# Patient Record
Sex: Male | Born: 1980 | Race: White | Hispanic: No | Marital: Married
Health system: Southern US, Community
[De-identification: ages and names within clinical notes are randomized; demographics above are authoritative.]

---
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
## 2008-12-25 ENCOUNTER — Emergency Department (HOSPITAL_COMMUNITY): Admission: EM | Admit: 2008-12-25 | Discharge: 2008-12-26 | Payer: Self-pay | Admitting: Emergency Medicine

## 2010-08-08 IMAGING — CR DG ANKLE COMPLETE 3+V*R*
3 series · 3 of 3 positions shown · non-contrast
Comparison: None

CLINICAL DATA: Laceration, broken window

RIGHT ANKLE - COMPLETE 3+ VIEW

[t ankle joint ap right]
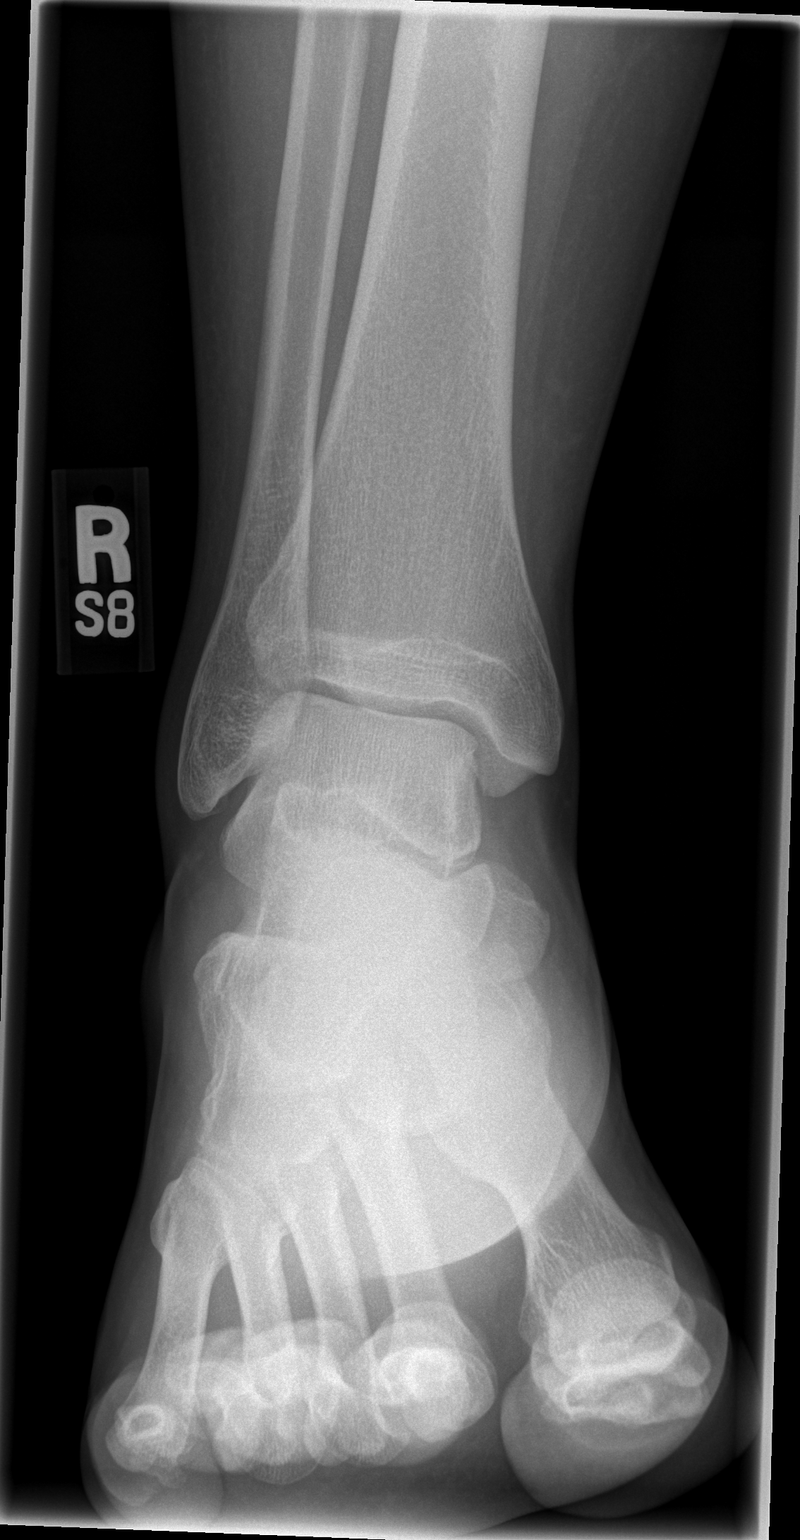

[t ankle joint oblique right]
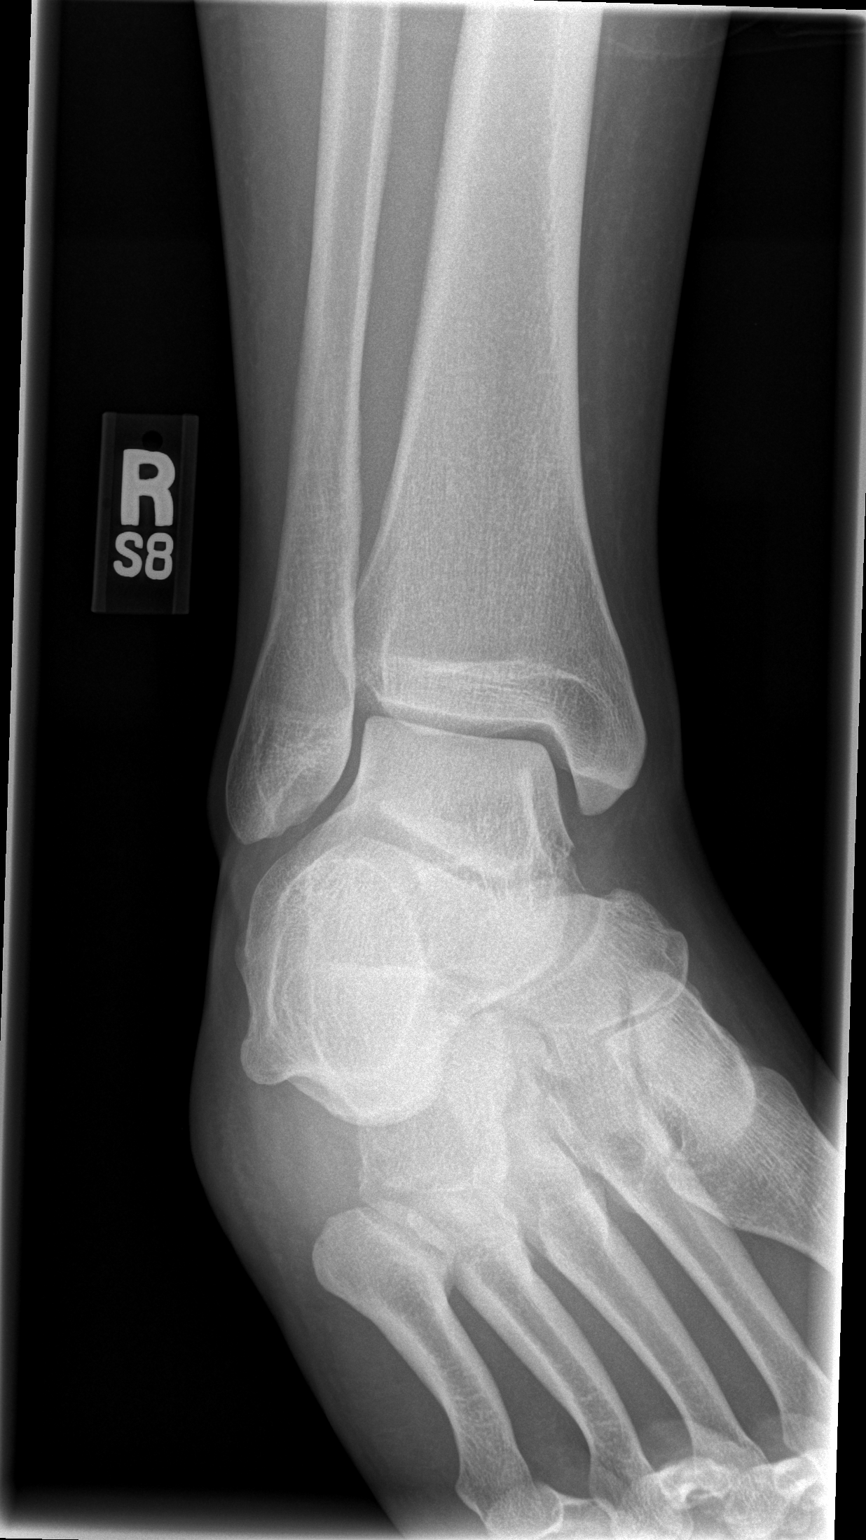

[t ankle joint lat right]
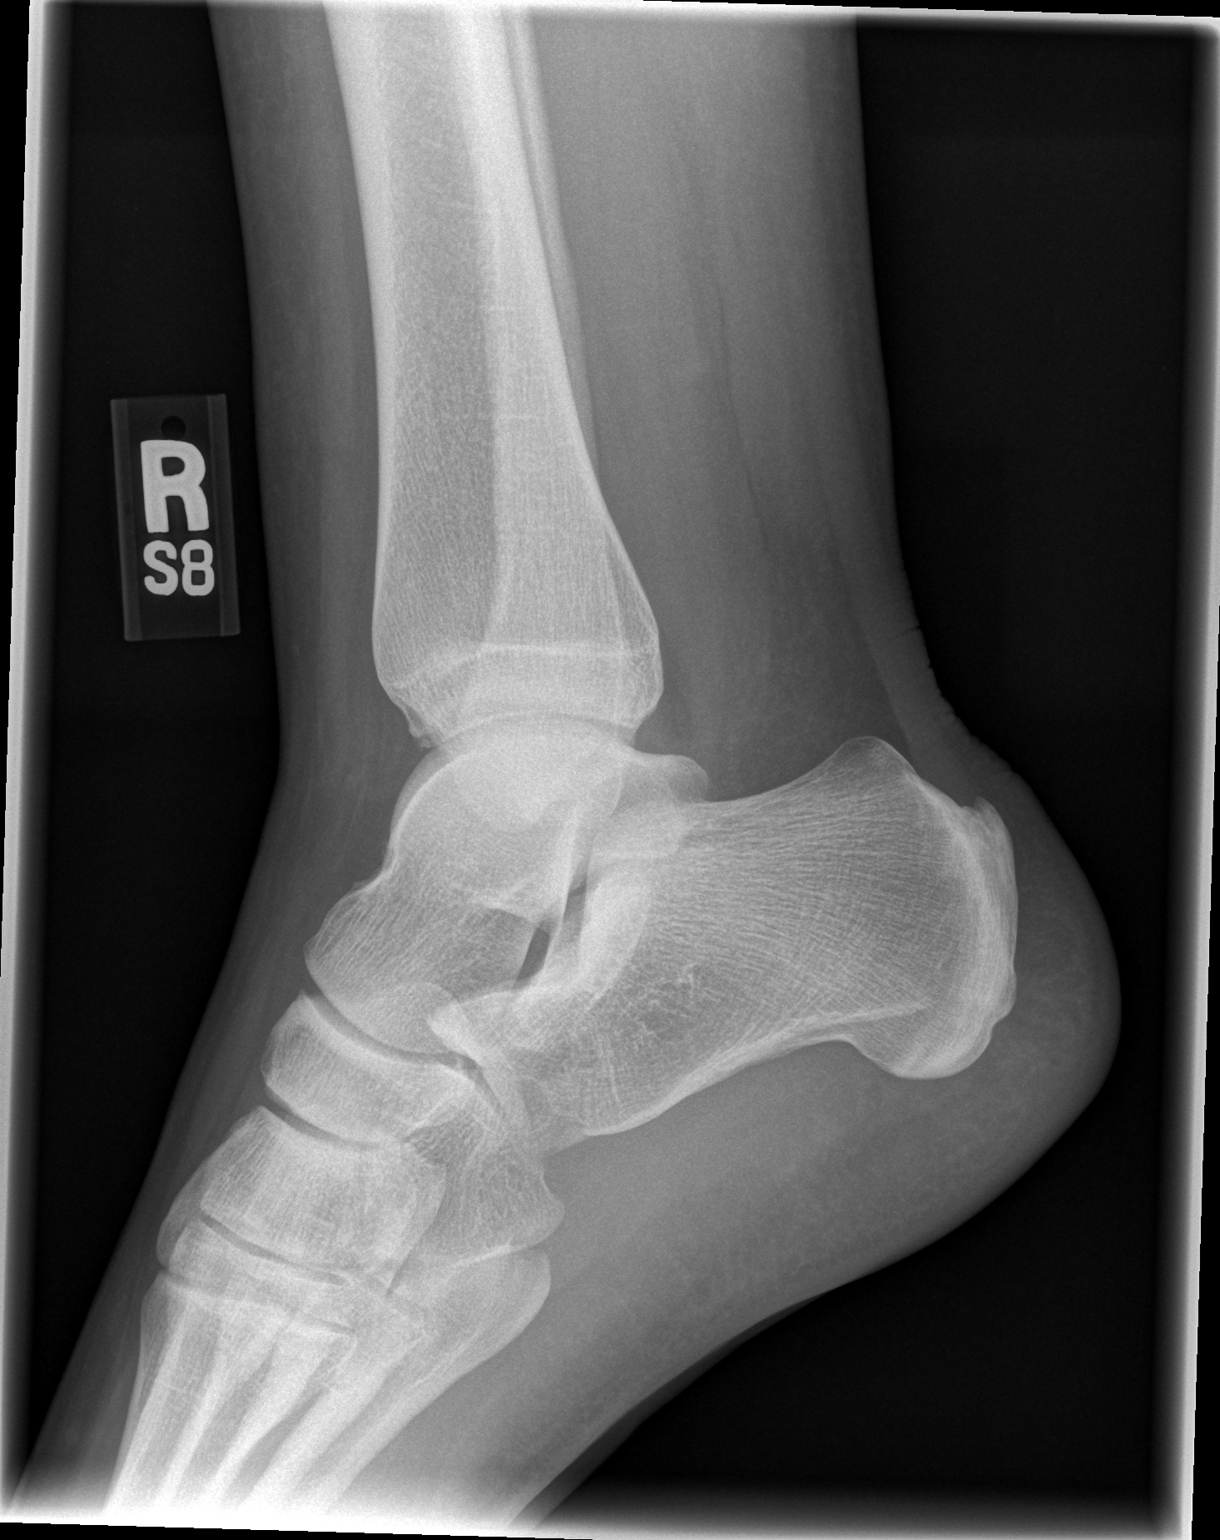

[3 of 3 positions shown; findings below may reference images not displayed]

FINDINGS: Ankle mortise intact.
Bone mineralization normal.
No acute fracture, dislocation, or bone destruction.
No radiopaque foreign body identified.
IMPRESSION: No acute abnormalities.

## 2018-09-24 ENCOUNTER — Emergency Department (HOSPITAL_COMMUNITY)
Admission: EM | Admit: 2018-09-24 | Discharge: 2018-09-24 | Disposition: A | Discharge status: Home / Self Care | Payer: 59 | Attending: Emergency Medicine | Admitting: Emergency Medicine

## 2018-09-24 ENCOUNTER — Encounter (HOSPITAL_COMMUNITY): Payer: Self-pay | Admitting: Emergency Medicine

## 2018-09-24 ENCOUNTER — Other Ambulatory Visit: Payer: Self-pay

## 2018-09-24 DIAGNOSIS — W260XXA Contact with knife, initial encounter: Secondary | ICD-10-CM | POA: Diagnosis not present

## 2018-09-24 DIAGNOSIS — S61411A Laceration without foreign body of right hand, initial encounter: Secondary | ICD-10-CM | POA: Insufficient documentation

## 2018-09-24 DIAGNOSIS — S6991XA Unspecified injury of right wrist, hand and finger(s), initial encounter: Secondary | ICD-10-CM | POA: Diagnosis present

## 2018-09-24 DIAGNOSIS — Y999 Unspecified external cause status: Secondary | ICD-10-CM | POA: Insufficient documentation

## 2018-09-24 DIAGNOSIS — Y9389 Activity, other specified: Secondary | ICD-10-CM | POA: Diagnosis not present

## 2018-09-24 DIAGNOSIS — Z23 Encounter for immunization: Secondary | ICD-10-CM | POA: Insufficient documentation

## 2018-09-24 DIAGNOSIS — Y929 Unspecified place or not applicable: Secondary | ICD-10-CM | POA: Insufficient documentation

## 2018-09-24 MED ORDER — LIDOCAINE HCL (PF) 1 % IJ SOLN
30.0000 mL | Freq: Once | INTRAMUSCULAR | Status: AC
  Administered 2018-09-24: 30 mL
  Filled 2018-09-24: qty 30

## 2018-09-24 MED ORDER — BACITRACIN ZINC 500 UNIT/GM EX OINT
TOPICAL_OINTMENT | Freq: Once | CUTANEOUS | Status: AC
  Administered 2018-09-24: 21:00:00 via TOPICAL
  Filled 2018-09-24: qty 0.9

## 2018-09-24 MED ORDER — TETANUS-DIPHTH-ACELL PERTUSSIS 5-2.5-18.5 LF-MCG/0.5 IM SUSP
0.5000 mL | Freq: Once | INTRAMUSCULAR | Status: AC
  Administered 2018-09-24: 22:00:00 0.5 mL via INTRAMUSCULAR
  Filled 2018-09-24: qty 0.5

## 2018-09-24 NOTE — Discharge Instructions (Addendum)
1. Medications: Tylenol or ibuprofen for pain, Continue any usual home medications  2. Treatment: ice for swelling, keep wound clean with warm soap and water and keep bandage dry, do not submerge in water for 24 hours  3. Follow Up: Please return in 10 days to have your stitches/staples removed or sooner if you have concerns. Return to the emergency department for increased redness, drainage of pus from the wound.   WOUND CARE  Keep area clean and dry for 24 hours. Do not remove bandage, if applied.  After 24 hours, remove bandage and wash wound gently with mild soap and warm water. Reapply a new bandage after cleaning wound, if directed.   Continue daily cleansing with soap and water until stitches/staples are removed.  Do not apply any ointments or creams to the wound while stitches/staples are in place, as this may cause delayed healing. Return if you experience any of the following signs of infection: Swelling, redness, pus drainage, streaking, fever >101.0 F  Return if you experience excessive bleeding that does not stop after 15-20 minutes of constant, firm pressure.

## 2018-09-24 NOTE — ED Triage Notes (Signed)
Pt was working on his jeep. Pt cut his hand with a carpenters knife.

## 2018-09-24 NOTE — ED Provider Notes (Signed)
[facility] EMERGENCY DEPARTMENT Provider Note   CSN: [id number] Arrival date & time: 09/24/18  2010    History   Chief Complaint No chief complaint on file.   HPI Wayne Yang is a 38 y.o. male otherwise healthy presenting to emergency department today with chief complaint of laceration to right hand.  This happened 30 minutes prior to arrival.  Patient states he was using a knife to cut a piece of plastic and the knife slipped landing on his right hand.  Laceration to dorsal aspect of right hand.  Bleeding is controlled.  Patient reports throbbing pain with movement, no pain at rest.  Pain does not radiate.  With movement he rates pain 6 out of 10 in severity.  He not take anything for pain prior to arrival.  He is left-hand dominant.  Patient is unsure when last tetanus immunization was.  History provided by patient.  History reviewed. No pertinent past medical history.  There are no active problems to display for this patient.   History reviewed. No pertinent surgical history.      Home Medications    Prior to Admission medications   Not on File    Family History No family history on file.  Social History Social History   Tobacco Use  . Smoking status: Not on file  Substance Use Topics  . Alcohol use: Not on file  . Drug use: Not on file     Allergies   Patient has no allergy information on record.   Review of Systems Review of Systems  Constitutional: Negative for fever.  Musculoskeletal: Negative for arthralgias and myalgias.  Skin: Positive for wound.     Physical Exam Updated Vital Signs BP 123/76 (BP Location: Right Arm)   Pulse 86   Temp 98.6 F (37 C) (Oral)   Resp 16   Ht 6' (1.829 m)   Wt 106.6 kg   SpO2 98%   BMI 31.87 kg/m   Physical Exam Vitals signs and nursing note reviewed.  Constitutional:      Appearance: He is well-developed. He is not toxic-appearing.  HENT:     Head: Normocephalic and  atraumatic.  Eyes:     General: No scleral icterus.       Right eye: No discharge.        Left eye: No discharge.     Conjunctiva/sclera: Conjunctivae normal.  Neck:     Musculoskeletal: Normal range of motion.  Cardiovascular:     Rate and Rhythm: Normal rate and regular rhythm.     Pulses: Normal pulses.          Radial pulses are 2+ on the right side and 2+ on the left side.     Heart sounds: Normal heart sounds.  Pulmonary:     Effort: Pulmonary effort is normal.     Breath sounds: Normal breath sounds.  Abdominal:     General: There is no distension.  Musculoskeletal: Normal range of motion.     Comments: Sensation grossly intact to light touch through each of the nerve distributions of the right upper extremity. Abduction and adduction of the fingers intact against resistance. Grip strength equal bilaterally. Supination and pronation intact against resistance. Strength 5/5 through the cardinal directions of the right wrist. Patient can touch the thumb to each one of the fingertips without difficulty.      Skin:    General: Skin is warm and dry.     Comments: 2 cm laceration  to dorsal aspect of right hand. Bleeding is controlled.  Neurological:     Mental Status: He is oriented to person, place, and time.     Comments: Fluent speech, no facial droop.  Psychiatric:        Behavior: Behavior normal.      ED Treatments / Results  Labs (all labs ordered are listed, but only abnormal results are displayed) Labs Reviewed - No data to display  EKG None  Radiology No results found.  Procedures .Marland KitchenLaceration Repair Date/Time: 09/24/2018 9:21 PM Performed by: Sherene Sires, PA-C Authorized by: Sherene Sires, PA-C   Consent:    Consent obtained:  Verbal   Consent given by:  Patient   Risks discussed:  Infection and pain   Alternatives discussed:  No treatment Universal protocol:    Patient identity confirmed:  Verbally with patient Anesthesia (see MAR  for exact dosages):    Anesthesia method:  Local infiltration   Local anesthetic:  Lidocaine 1% w/o epi Laceration details:    Location:  Hand   Hand location:  R hand, dorsum   Length (cm):  2 Repair type:    Repair type:  Simple Pre-procedure details:    Preparation:  Patient was prepped and draped in usual sterile fashion Exploration:    Hemostasis achieved with:  Direct pressure   Wound exploration: wound explored through full range of motion and entire depth of wound probed and visualized     Contaminated: no   Treatment:    Area cleansed with:  Saline   Amount of cleaning:  Standard   Irrigation solution:  Sterile water   Irrigation volume:  200 ml   Irrigation method:  Pressure wash   Visualized foreign bodies/material removed: no   Skin repair:    Repair method:  Sutures   Suture size:  5-0   Suture material:  Prolene   Suture technique:  Simple interrupted   Number of sutures:  4 Approximation:    Approximation:  Close Post-procedure details:    Dressing:  Bulky dressing   Patient tolerance of procedure:  Tolerated well, no immediate complications   (including critical care time)  Medications Ordered in ED Medications  Tdap (BOOSTRIX) injection 0.5 mL (0.5 mLs Intramuscular Given 09/24/18 2142)  lidocaine (PF) (XYLOCAINE) 1 % injection 30 mL (30 mLs Infiltration Given by Other 09/24/18 2100)  bacitracin ointment ( Topical Given by Other 09/24/18 2100)     Initial Impression / Assessment and Plan / ED Course  I have reviewed the triage vital signs and the nursing notes.  Pertinent labs & imaging results that were available during my care of the patient were reviewed by me and considered in my medical decision making (see chart for details).   Patient presents to the emergency department with laceration to right hand which occurred within 30 minutes PTA. Laceration occurred with knife with a new blade. Patient nontoxic appearing, resting comfortably. Pressure  irrigation performed. Wound explored and base of wound visualized in a bloodless field without evidence of foreign body. Laceration repair per procedure note above, tolerated well. Tetanus updated at today's visit. Do not feel that abx are indicated at this time based on wound appearance and lack of significant comorbidities. Discussed suture home care as well as need for wound recheck and suture removal in 10-14 days.  I discussed results, treatment plan, need for follow-up, and return precautions with the patient including signs of infection. Provided opportunity for questions, patient confirmed understanding and is  in agreement with plan. Recommend pcp/ortho follow up.    This note was prepared with assistance of Conservation officer, historic buildingsDragon voice recognition software. Occasional wrong-word or sound-a-like substitutions may have occurred due to the inherent limitations of voice recognition software.      Final Clinical Impressions(s) / ED Diagnoses   Final diagnoses:  Laceration of right hand without foreign body, initial encounter    ED Discharge Orders    None       Kathyrn Lasslbrizze,  E, PA-C 09/24/18 2307    Tegeler, Canary Brimhristopher J, MD 09/24/18 2350

## 2019-07-29 ENCOUNTER — Ambulatory Visit: Payer: 59 | Attending: Internal Medicine

## 2019-07-29 DIAGNOSIS — Z23 Encounter for immunization: Secondary | ICD-10-CM

## 2019-07-29 NOTE — Progress Notes (Signed)
   Covid-19 Vaccination Clinic  Name:  ZAYN SELLEY    MRN: [id number] DOB: 1981/03/16  07/29/2019  Mr. Mccabe was observed post Covid-19 immunization for 15 minutes without incidence. He was provided with Vaccine Information Sheet and instruction to access the V-Safe system.   Mr. Sanluis was instructed to call 911 with any severe reactions post vaccine: Marland Kitchen Difficulty breathing  . Swelling of your face and throat  . A fast heartbeat  . A bad rash all over your body  . Dizziness and weakness    Immunizations Administered    Name Date Dose VIS Date Route   Pfizer COVID-19 Vaccine 07/29/2019  9:30 AM 0.3 mL 05/12/2019 Intramuscular   Manufacturer: [employer], [employer]   Lot: [id number]   NDC: [address]-7

## 2019-07-30 ENCOUNTER — Ambulatory Visit: Payer: 59

## 2019-08-19 ENCOUNTER — Ambulatory Visit: Payer: 59 | Attending: Internal Medicine

## 2019-08-19 DIAGNOSIS — Z23 Encounter for immunization: Secondary | ICD-10-CM

## 2019-08-19 NOTE — Progress Notes (Signed)
   Covid-19 Vaccination Clinic  Name:  CRUZE ZINGARO    MRN: [id number] DOB: 11-Aug-1980  08/19/2019  Mr. Luckow was observed post Covid-19 immunization for 15 minutes without incident. He was provided with Vaccine Information Sheet and instruction to access the V-Safe system.   Mr. Roark was instructed to call 911 with any severe reactions post vaccine: Marland Kitchen Difficulty breathing  . Swelling of face and throat  . A fast heartbeat  . A bad rash all over body  . Dizziness and weakness   Immunizations Administered    Name Date Dose VIS Date Route   Pfizer COVID-19 Vaccine 08/19/2019 10:29 AM 0.3 mL 05/12/2019 Intramuscular   Manufacturer: [employer], [employer]   Lot: [id number]   NDC: [address]-4

## 2019-08-23 ENCOUNTER — Ambulatory Visit: Payer: 59
